# Patient Record
Sex: Female | Born: 1990 | Race: Black or African American | Hispanic: No | Marital: Married | Smoking: Never smoker
Health system: Southern US, Community
[De-identification: ages and names within clinical notes are randomized; demographics above are authoritative.]

## PROBLEM LIST (undated history)

## (undated) DIAGNOSIS — J45909 Unspecified asthma, uncomplicated: Secondary | ICD-10-CM

---

## 2017-12-02 ENCOUNTER — Encounter (HOSPITAL_BASED_OUTPATIENT_CLINIC_OR_DEPARTMENT_OTHER): Payer: Self-pay | Admitting: Emergency Medicine

## 2017-12-02 ENCOUNTER — Emergency Department (HOSPITAL_BASED_OUTPATIENT_CLINIC_OR_DEPARTMENT_OTHER)
Admission: EM | Admit: 2017-12-02 | Discharge: 2017-12-02 | Disposition: A | Discharge status: Home / Self Care | Payer: Self-pay | Attending: Emergency Medicine | Admitting: Emergency Medicine

## 2017-12-02 ENCOUNTER — Emergency Department (HOSPITAL_BASED_OUTPATIENT_CLINIC_OR_DEPARTMENT_OTHER): Payer: Self-pay

## 2017-12-02 ENCOUNTER — Other Ambulatory Visit: Payer: Self-pay

## 2017-12-02 DIAGNOSIS — J45909 Unspecified asthma, uncomplicated: Secondary | ICD-10-CM | POA: Insufficient documentation

## 2017-12-02 DIAGNOSIS — Z79899 Other long term (current) drug therapy: Secondary | ICD-10-CM | POA: Insufficient documentation

## 2017-12-02 DIAGNOSIS — R0602 Shortness of breath: Secondary | ICD-10-CM | POA: Insufficient documentation

## 2017-12-02 HISTORY — DX: Unspecified asthma, uncomplicated: J45.909

## 2017-12-02 LAB — BASIC METABOLIC PANEL
Anion gap: 9 (ref 5–15)
BUN: 11 mg/dL (ref 6–20)
CALCIUM: 9.4 mg/dL (ref 8.9–10.3)
CHLORIDE: 103 mmol/L (ref 101–111)
CO2: 26 mmol/L (ref 22–32)
CREATININE: 0.77 mg/dL (ref 0.44–1.00)
GFR calc Af Amer: >60 mL/min (ref >60)
GFR calc non Af Amer: >60 mL/min (ref >60)
GLUCOSE: 117 mg/dL — AB (ref 65–99)
Potassium: 3.5 mmol/L (ref 3.5–5.1)
Sodium: 138 mmol/L (ref 135–145)

## 2017-12-02 LAB — CBC
HCT: 40.3 % (ref 36.0–46.0)
HEMOGLOBIN: 14.1 g/dL (ref 12.0–15.0)
MCH: 28.7 pg (ref 26.0–34.0)
MCHC: 35 g/dL (ref 30.0–36.0)
MCV: 82.1 fL (ref 78.0–100.0)
Platelets: 286 10*3/uL (ref 150–400)
RBC: 4.91 MIL/uL (ref 3.87–5.11)
RDW: 12.4 % (ref 11.5–15.5)
WBC: 5.6 10*3/uL (ref 4.0–10.5)

## 2017-12-02 LAB — URINALYSIS, ROUTINE W REFLEX MICROSCOPIC
BILIRUBIN URINE: NEGATIVE
Glucose, UA: NEGATIVE mg/dL
Hgb urine dipstick: NEGATIVE
Ketones, ur: NEGATIVE mg/dL
Leukocytes, UA: NEGATIVE
Nitrite: NEGATIVE
Protein, ur: NEGATIVE mg/dL
SPECIFIC GRAVITY, URINE: 1.01 (ref 1.005–1.030)
pH: 7.5 (ref 5.0–8.0)

## 2017-12-02 LAB — TROPONIN I: Troponin I: <0.03 ng/mL (ref <0.03)

## 2017-12-02 LAB — PREGNANCY, URINE: PREG TEST UR: NEGATIVE

## 2017-12-02 MED ORDER — ALBUTEROL SULFATE (2.5 MG/3ML) 0.083% IN NEBU
INHALATION_SOLUTION | RESPIRATORY_TRACT | Status: AC
  Administered 2017-12-02: 2.5 mg
  Filled 2017-12-02: qty 3

## 2017-12-02 MED ORDER — IPRATROPIUM-ALBUTEROL 0.5-2.5 (3) MG/3ML IN SOLN
RESPIRATORY_TRACT | Status: AC
  Administered 2017-12-02: 3 mL
  Filled 2017-12-02: qty 3

## 2017-12-02 MED ORDER — ALBUTEROL SULFATE (2.5 MG/3ML) 0.083% IN NEBU
5.0000 mg | INHALATION_SOLUTION | Freq: Once | RESPIRATORY_TRACT | Status: AC
  Administered 2017-12-02: 5 mg via RESPIRATORY_TRACT
  Filled 2017-12-02: qty 6

## 2017-12-02 MED ORDER — ALBUTEROL SULFATE HFA 108 (90 BASE) MCG/ACT IN AERS: 1.0000 | INHALATION_SPRAY | Freq: Four times a day (QID) | RESPIRATORY_TRACT | 0 refills | Status: AC | PRN

## 2017-12-02 MED FILL — PROVENTIL HFA 108 (90 BASE): 108 (90 BAS | 25 days supply | Qty: 7 | Fill #0

## 2017-12-02 NOTE — ED Notes (Signed)
No shortness of breath noted with Pt. Speaking

## 2017-12-02 NOTE — ED Provider Notes (Signed)
[facility] EMERGENCY DEPARTMENT Provider Note   CSN: [id number] Arrival date & time: 12/02/17  1219     History   Chief Complaint Chief Complaint  Patient presents with  . Shortness of Breath    HPI Shari Henry is a 27 y.o. female.  The history is provided by the patient and medical records. No language interpreter was used.   Shari Henry is a 27 y.o. female  with a PMH of asthma who presents to the Emergency Department complaining of persistent shortness of breath x 1 week. Symptoms have been constant. She had neb treatment in triage which did seem to help. Other than that, no alleviating factors noted. No aggravating factors either. Associated symptoms include sneezing, mild dry cough and intermittent muscle cramps. No chest pain, abdominal pain, n/v, fever, back pain, sore throat, nasal congestion. No hx of DVT/PE/coagulopathy. Not on OCP's. No leg swelling, hemoptysis. No recent long travel / surgeries / immobilizations prior to symptom onset. She did travel 3 hours yesterday in the car, but again this was after symptoms have been ongoing for the last week. She does note hx of asthma, but has not had a flare in several (> 5-7) years. She is on no medications for asthma. She did recently move to the area about 2 weeks ago.  Past Medical History:  Diagnosis Date  . Asthma     There are no active problems to display for this patient.   History reviewed. No pertinent surgical history.   OB History   None      Home Medications    Prior to Admission medications   Medication Sig Start Date End Date Taking? Authorizing Provider  levocetirizine (XYZAL) 5 MG tablet Take 5 mg by mouth every evening.   Yes [provider]  albuterol (PROVENTIL HFA;VENTOLIN HFA) 108 (90 Base) MCG/ACT inhaler Inhale 1-2 puffs into the lungs every 6 (six) hours as needed for wheezing or shortness of breath. 12/02/17   Shari Henry, Shari Picket, PA-C    Family History History  reviewed. No pertinent family history.  Social History Social History   Tobacco Use  . Smoking status: Never Smoker  . Smokeless tobacco: Never Used  Substance Use Topics  . Alcohol use: Not Currently  . Drug use: Never     Allergies   Patient has no known allergies.   Review of Systems Review of Systems  HENT: Positive for sneezing.   Respiratory: Positive for cough and shortness of breath.   Cardiovascular: Negative for chest pain.  Musculoskeletal: Positive for myalgias.  All other systems reviewed and are negative.    Physical Exam Updated Vital Signs BP (!) 129/55 (BP Location: Right Arm)   Pulse 86   Temp 98 F (36.7 C) (Oral)   Resp (!) 22   Ht 5\' 8"  (1.727 m)   Wt 82.6 kg (182 lb)   LMP 11/30/2017   SpO2 100%   BMI 27.67 kg/m   Physical Exam  Constitutional: She is oriented to person, place, and time. She appears well-developed and well-nourished. No distress.  HENT:  Head: Normocephalic and atraumatic.  Cardiovascular: Normal rate, regular rhythm and normal heart sounds.  No murmur heard. Pulmonary/Chest: Effort normal and breath sounds normal. No respiratory distress.  Lungs clear to ausculation bilaterally.  Abdominal: Soft. She exhibits no distension. There is no tenderness.  Musculoskeletal:  No lower extremity edema or calf tenderness.  Neurological: She is alert and oriented to person, place, and time.  Skin: Skin is  warm and dry.  Nursing note and vitals reviewed.    ED Treatments / Results  Labs (all labs ordered are listed, but only abnormal results are displayed) Labs Reviewed  BASIC METABOLIC PANEL - Abnormal; Notable for the following components:      Result Value   Glucose, Bld 117 (*)    All other components within normal limits  URINALYSIS, ROUTINE W REFLEX MICROSCOPIC - Abnormal; Notable for the following components:   Color, Urine STRAW (*)    All other components within normal limits  CBC  PREGNANCY, URINE  TROPONIN I      EKG EKG Interpretation  Date/Time:  Monday December 02 2017 15:19:53 EDT Ventricular Rate:  74 PR Interval:    QRS Duration: 94 QT Interval:  372 QTC Calculation: 413 R Axis:   76 Text Interpretation:  Sinus rhythm Borderline T abnormalities, inferior leads No STEMI. No old tracing for comparison.  Confirmed by Alona BeneLong, Joshua [phone]) on 12/02/2017 3:40:36 PM Also confirmed by Alona BeneLong, Joshua [phone]), editor Sheppard EvensSimpson, Miranda ([phone])  on 12/02/2017 4:05:14 PM   Radiology Dg Chest 2 View  Result Date: 12/02/2017 CLINICAL DATA:  Shortness of breath for 1 week EXAM: CHEST - 2 VIEW COMPARISON:  None. FINDINGS: The heart size and mediastinal contours are within normal limits. Both lungs are clear. The visualized skeletal structures are unremarkable. IMPRESSION: No active cardiopulmonary disease. Electronically Signed   By: Alcide CleverMark  Lukens M.D.   On: 12/02/2017 15:51    Procedures Procedures (including critical care time)  Medications Ordered in ED Medications  albuterol (PROVENTIL) (2.5 MG/3ML) 0.083% nebulizer solution (2.5 mg  Given 12/02/17 1239)  ipratropium-albuterol (DUONEB) 0.5-2.5 (3) MG/3ML nebulizer solution (3 mLs  Given 12/02/17 1239)  albuterol (PROVENTIL) (2.5 MG/3ML) 0.083% nebulizer solution 5 mg (5 mg Nebulization Given 12/02/17 1546)     Initial Impression / Assessment and Plan / ED Course  I have reviewed the triage vital signs and the nursing notes.  Pertinent labs & imaging results that were available during my care of the patient were reviewed by me and considered in my medical decision making (see chart for details).    Shari Henry is a 27 y.o. female who presents to ED for shortness of breath over the last week associated with sneezing and intermittent dry cough which she attributes to allergies. On exam, patient is afebrile, hemodynamically stable with clear lung exam. She did get neb treatment prior to my evaluation and notes feeling very much improved. Another neb treatment  given and she reports SOB now resolved. She feels back to baseline. Her CXR was without acute findings - no PNA. PERC negative making PE unlikely. EKG non-ischemic. Labs reviewed and reassuring. Normal trop and white count.    Final Clinical Impressions(s) / ED Diagnoses   Final diagnoses:  Shortness of breath    ED Discharge Orders        Ordered    albuterol (PROVENTIL HFA;VENTOLIN HFA) 108 (90 Base) MCG/ACT inhaler  Every 6 hours PRN     12/02/17 1624       Darris Staiger, Shari PicketJaime Pilcher, PA-C 12/02/17 1636    Maia PlanLong, Joshua G, MD 12/03/17 1002

## 2017-12-02 NOTE — Discharge Instructions (Signed)
It was my pleasure taking care of you today!   Albuterol inhaler as needed. Take Claritin or Zyrtec daily to help with allergy symptoms.   Follow up with a primary care doctor (see attachment).   Return to ER for new or worsening symptoms, any additional concerns.

## 2017-12-02 NOTE — ED Triage Notes (Signed)
Patient states that she has a hx of Asthma and her SOB has worsened over the last week. Tee patient states that she has decreased Co2

## 2017-12-02 NOTE — ED Notes (Signed)
BBS clear post treatment. Patient stated she felt worse when I told her she would have to return to waiting room. RN to speak with her

## 2019-02-21 IMAGING — CR DG CHEST 2V
2 series · 2 of 2 positions shown · non-contrast
Comparison: None.

CLINICAL DATA: Shortness of breath for 1 week

EXAM:
CHEST - 2 VIEW

[w chest pa]
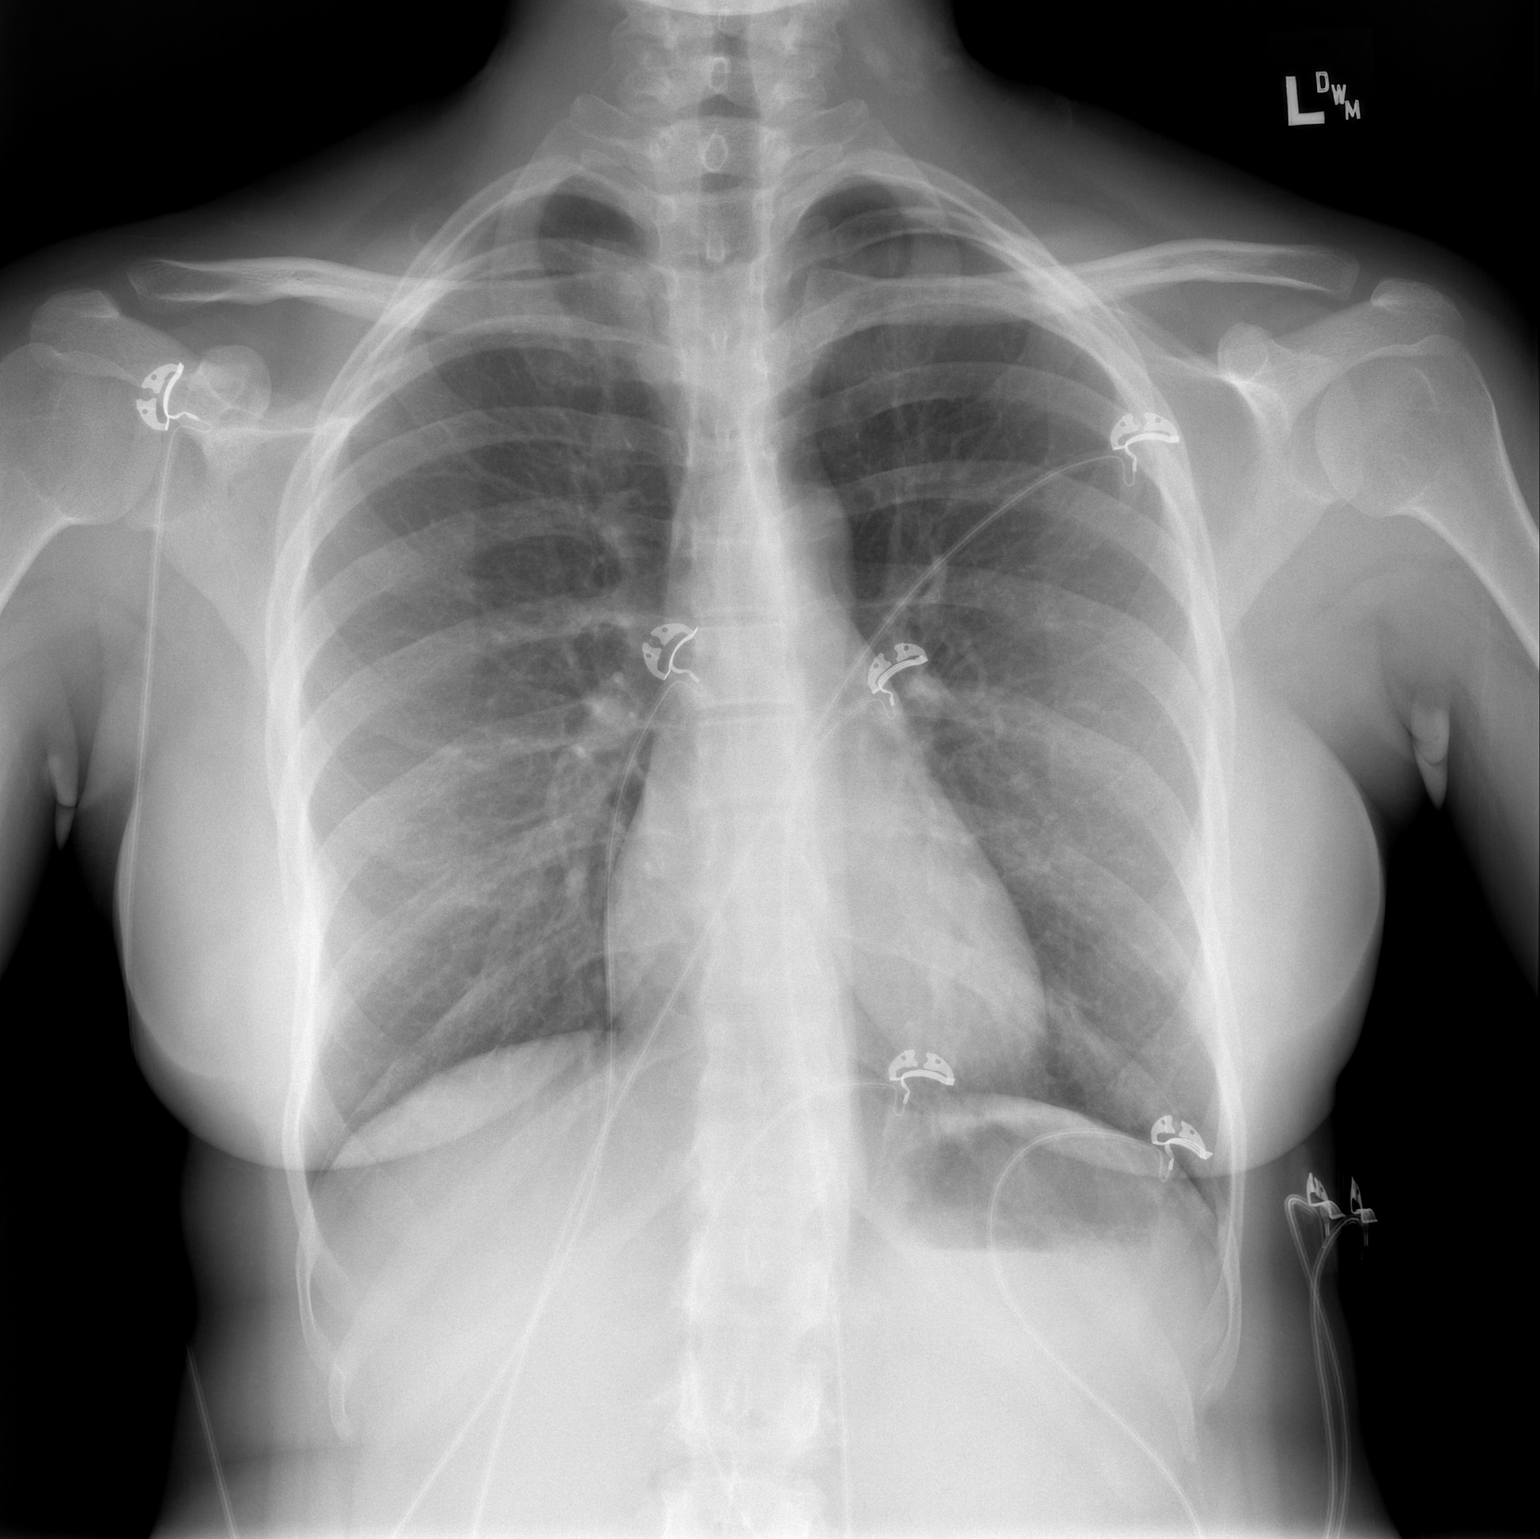

[w chest lat]
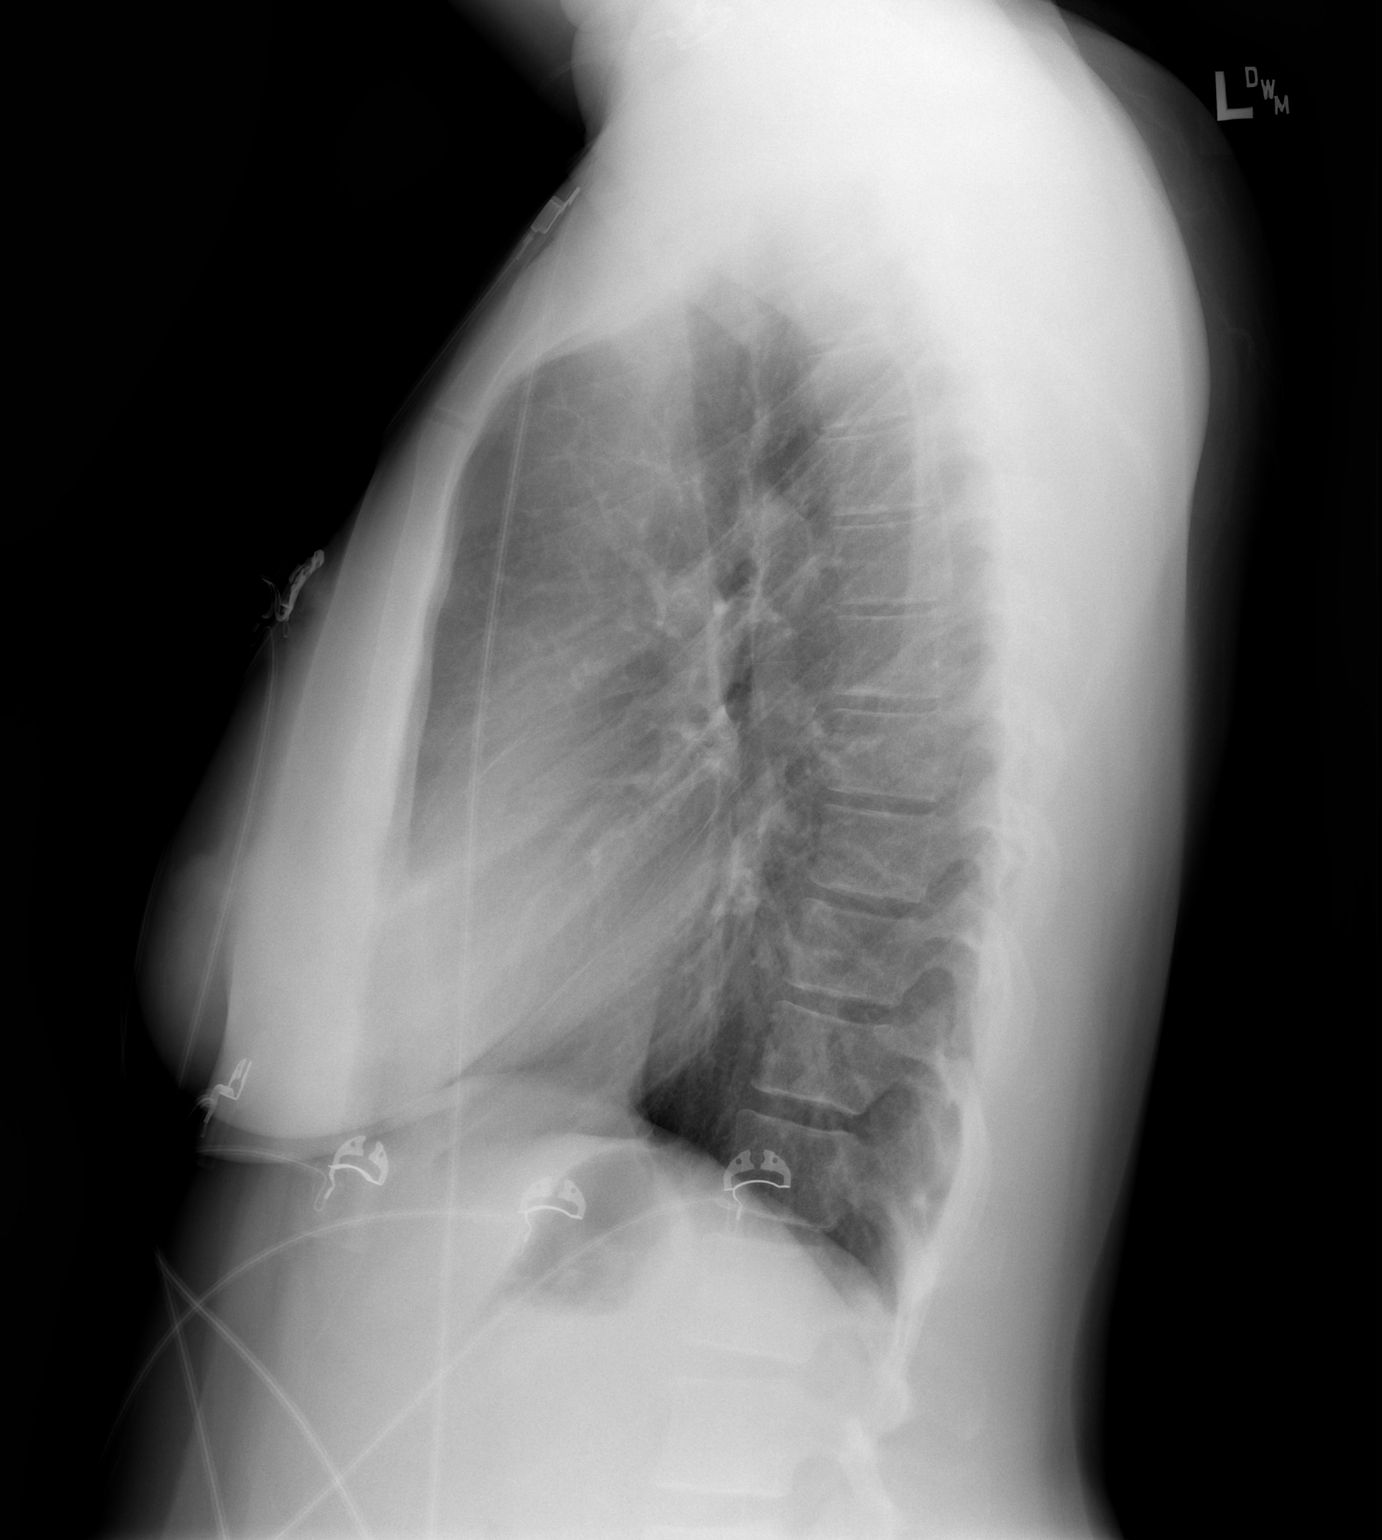

[2 of 2 positions shown; findings below may reference images not displayed]

FINDINGS: The heart size and mediastinal contours are within normal limits.
Both lungs are clear. The visualized skeletal structures are
unremarkable.
IMPRESSION: No active cardiopulmonary disease.
